# Patient Record
Sex: Male | Born: 1990 | Race: White | Hispanic: No | Marital: Single | Smoking: Former smoker
Health system: Southern US, Community
[De-identification: ages and names within clinical notes are randomized; demographics above are authoritative.]

## PROBLEM LIST (undated history)

## (undated) DIAGNOSIS — J45909 Unspecified asthma, uncomplicated: Secondary | ICD-10-CM

## (undated) DIAGNOSIS — F32A Depression, unspecified: Secondary | ICD-10-CM

## (undated) DIAGNOSIS — F329 Major depressive disorder, single episode, unspecified: Secondary | ICD-10-CM

## (undated) DIAGNOSIS — F419 Anxiety disorder, unspecified: Secondary | ICD-10-CM

## (undated) HISTORY — PX: WISDOM TOOTH EXTRACTION: SHX21

---

## 2014-05-13 ENCOUNTER — Emergency Department (HOSPITAL_COMMUNITY): Payer: Commercial Managed Care - PPO

## 2014-05-13 ENCOUNTER — Emergency Department (HOSPITAL_COMMUNITY)
Admission: EM | Admit: 2014-05-13 | Discharge: 2014-05-13 | Discharge status: Against Medical Advice | Payer: Commercial Managed Care - PPO | Attending: Emergency Medicine | Admitting: Emergency Medicine

## 2014-05-13 ENCOUNTER — Emergency Department (HOSPITAL_COMMUNITY)
Admission: EM | Admit: 2014-05-13 | Discharge: 2014-05-14 | Disposition: A | Discharge status: Home / Self Care | Payer: Commercial Managed Care - PPO | Attending: Emergency Medicine | Admitting: Emergency Medicine

## 2014-05-13 ENCOUNTER — Encounter (HOSPITAL_COMMUNITY): Payer: Self-pay | Admitting: *Deleted

## 2014-05-13 DIAGNOSIS — F419 Anxiety disorder, unspecified: Secondary | ICD-10-CM | POA: Insufficient documentation

## 2014-05-13 DIAGNOSIS — Z87891 Personal history of nicotine dependence: Secondary | ICD-10-CM | POA: Insufficient documentation

## 2014-05-13 DIAGNOSIS — F329 Major depressive disorder, single episode, unspecified: Secondary | ICD-10-CM | POA: Insufficient documentation

## 2014-05-13 DIAGNOSIS — J45901 Unspecified asthma with (acute) exacerbation: Secondary | ICD-10-CM | POA: Diagnosis not present

## 2014-05-13 DIAGNOSIS — M2550 Pain in unspecified joint: Secondary | ICD-10-CM | POA: Insufficient documentation

## 2014-05-13 DIAGNOSIS — M25561 Pain in right knee: Secondary | ICD-10-CM | POA: Diagnosis not present

## 2014-05-13 DIAGNOSIS — R0602 Shortness of breath: Secondary | ICD-10-CM | POA: Diagnosis present

## 2014-05-13 DIAGNOSIS — R52 Pain, unspecified: Secondary | ICD-10-CM

## 2014-05-13 DIAGNOSIS — J45909 Unspecified asthma, uncomplicated: Secondary | ICD-10-CM | POA: Insufficient documentation

## 2014-05-13 DIAGNOSIS — R531 Weakness: Secondary | ICD-10-CM | POA: Insufficient documentation

## 2014-05-13 HISTORY — DX: Depression, unspecified: F32.A

## 2014-05-13 HISTORY — DX: Unspecified asthma, uncomplicated: J45.909

## 2014-05-13 HISTORY — DX: Major depressive disorder, single episode, unspecified: F32.9

## 2014-05-13 HISTORY — DX: Anxiety disorder, unspecified: F41.9

## 2014-05-13 NOTE — ED Notes (Signed)
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

## 2014-05-13 NOTE — ED Notes (Signed)
Pt reports having pain that started to left knee yesterday, now is spreading to all his joints. Has redness and bruising around his knees. Reports weakness/numbness sensation to all extremities. No neuro deficits noted at triage. No acute distress noted at triage.

## 2014-05-14 LAB — CBC
HCT: 37.3 % — ABNORMAL LOW (ref 39.0–52.0)
HEMOGLOBIN: 12.8 g/dL — AB (ref 13.0–17.0)
MCH: 29.6 pg (ref 26.0–34.0)
MCHC: 34.3 g/dL (ref 30.0–36.0)
MCV: 86.1 fL (ref 78.0–100.0)
PLATELETS: 205 10*3/uL (ref 150–400)
RBC: 4.33 MIL/uL (ref 4.22–5.81)
RDW: 13.1 % (ref 11.5–15.5)
WBC: 7.9 10*3/uL (ref 4.0–10.5)

## 2014-05-14 LAB — BASIC METABOLIC PANEL
ANION GAP: 13 (ref 5–15)
BUN: 16 mg/dL (ref 6–23)
CO2: 24 mEq/L (ref 19–32)
CREATININE: 0.93 mg/dL (ref 0.50–1.35)
Calcium: 9.1 mg/dL (ref 8.4–10.5)
Chloride: 101 mEq/L (ref 96–112)
GFR calc non Af Amer: >90 mL/min (ref >90)
Glucose, Bld: 101 mg/dL — ABNORMAL HIGH (ref 70–99)
Potassium: 4.1 mEq/L (ref 3.7–5.3)
Sodium: 138 mEq/L (ref 137–147)

## 2014-05-14 LAB — CK: CK TOTAL: 464 U/L — AB (ref 7–232)

## 2014-05-14 MED ORDER — NAPROXEN 500 MG PO TABS: 500.0000 mg | ORAL_TABLET | Freq: Two times a day (BID) | ORAL | Status: AC

## 2014-05-14 MED ORDER — TRAMADOL HCL 50 MG PO TABS: 50.0000 mg | ORAL_TABLET | Freq: Four times a day (QID) | ORAL | Status: AC | PRN

## 2014-05-14 MED ORDER — KETOROLAC TROMETHAMINE 60 MG/2ML IM SOLN
60.0000 mg | Freq: Once | INTRAMUSCULAR | Status: AC
  Administered 2014-05-14: 60 mg via INTRAMUSCULAR
  Filled 2014-05-14: qty 2

## 2014-05-14 NOTE — ED Notes (Signed)
Patient resting quietly with eyes closed when entering the room. Appears in no respiratory distress.
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
## 2014-05-14 NOTE — Discharge Instructions (Signed)
Your xrays are normal - take the medicines as prescribed, return to the ER for   #1 worsening pain #2 fever #3 red or worsening swelling of the joints  Please call your doctor for a followup appointment within 24-48 hours. When you talk to your doctor please let them know that you were seen in the emergency department and have them acquire all of your records so that they can discuss the findings with you and formulate a treatment plan to fully care for your new and ongoing problems.
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
## 2016-01-29 ENCOUNTER — Emergency Department (HOSPITAL_COMMUNITY)
Admission: EM | Admit: 2016-01-29 | Discharge: 2016-01-29 | Disposition: A | Discharge status: Home / Self Care | Payer: Medicaid Other | Attending: Emergency Medicine | Admitting: Emergency Medicine

## 2016-01-29 ENCOUNTER — Encounter (HOSPITAL_COMMUNITY): Payer: Self-pay

## 2016-01-29 DIAGNOSIS — Z202 Contact with and (suspected) exposure to infections with a predominantly sexual mode of transmission: Secondary | ICD-10-CM | POA: Insufficient documentation

## 2016-01-29 DIAGNOSIS — J45909 Unspecified asthma, uncomplicated: Secondary | ICD-10-CM | POA: Diagnosis not present

## 2016-01-29 DIAGNOSIS — Z87891 Personal history of nicotine dependence: Secondary | ICD-10-CM | POA: Diagnosis not present

## 2016-01-29 MED ORDER — CEFTRIAXONE SODIUM 250 MG IJ SOLR
250.0000 mg | Freq: Once | INTRAMUSCULAR | Status: AC
  Administered 2016-01-29: 250 mg via INTRAMUSCULAR
  Filled 2016-01-29: qty 250

## 2016-01-29 MED ORDER — AZITHROMYCIN 250 MG PO TABS
1000.0000 mg | ORAL_TABLET | Freq: Once | ORAL | Status: AC
  Administered 2016-01-29: 1000 mg via ORAL
  Filled 2016-01-29: qty 4

## 2016-01-29 MED ORDER — LIDOCAINE HCL (PF) 1 % IJ SOLN
INTRAMUSCULAR | Status: AC
  Administered 2016-01-29: 1 mL
  Filled 2016-01-29: qty 5

## 2016-01-29 NOTE — ED Notes (Signed)
Patient Alert and oriented X4. Stable and ambulatory. Patient verbalized understanding of the discharge instructions.  Patient belongings were taken by the patient.  

## 2016-01-29 NOTE — ED Triage Notes (Signed)
Pt states he "was with another girl" and wants to be tested for sexually transmitted infections. He denies being symptomatic.

## 2016-01-29 NOTE — ED Provider Notes (Signed)
[facility]-EMERGENCY DEPT Provider Note   CSN: [id number] Arrival date & time: 01/29/16  2203     History   Chief Complaint Chief Complaint  Patient presents with  . Exposure to STD    HPI Gilbert Smith is a [age] y.o. male.  The history is provided by the patient and medical records.     25 year old male with history of anxiety, asthma, depression, presenting to the ED with likely exposure to STD. He states he was sexually active with a male a few days ago who reportedly told him that she was "positive" for some type of STD but he does not know what. States he does not have any symptoms currently but would like to be checked for STD.  He has no hx of same.  No urinary symptoms, abdominal pain, fever, chills, sweats, rash, or penile discharge.  Past Medical History:  Diagnosis Date  . Anxiety   . Asthma   . Depression     There are no active problems to display for this patient.   Past Surgical History:  Procedure Laterality Date  . WISDOM TOOTH EXTRACTION         Home Medications    Prior to Admission medications   Medication Sig Start Date End Date Taking? Authorizing Provider  acetaminophen (TYLENOL) 500 MG tablet Take 500-1,000 mg by mouth every 6 (six) hours as needed (for pain.).    Historical Provider, MD  naproxen (NAPROSYN) 500 MG tablet Take 1 tablet (500 mg total) by mouth 2 (two) times daily with a meal. 05/14/14   Eber HongBrian Miller, MD  traMADol (ULTRAM) 50 MG tablet Take 1 tablet (50 mg total) by mouth every 6 (six) hours as needed. 05/14/14   Eber HongBrian Miller, MD    Family History No family history on file.  Social History Social History  Substance Use Topics  . Smoking status: Former Smoker    Quit date: 11/13/2013  . Smokeless tobacco: Never Used  . Alcohol use No     Allergies   Review of patient's allergies indicates no known allergies.   Review of Systems Review of Systems  Genitourinary:       STD check  All other systems reviewed and are  negative.    Physical Exam Updated Vital Signs BP 134/89 (BP Location: Right Arm)   Pulse 76   Temp 98.4 F (36.9 C) (Oral)   Resp 20   Wt 80.9 kg   SpO2 100%   BMI 22.91 kg/m   Physical Exam  Constitutional: He is oriented to person, place, and time. He appears well-developed and well-nourished.  HENT:  Head: Normocephalic and atraumatic.  Mouth/Throat: Oropharynx is clear and moist.  Eyes: Conjunctivae and EOM are normal. Pupils are equal, round, and reactive to light.  Neck: Normal range of motion.  Cardiovascular: Normal rate, regular rhythm and normal heart sounds.   Pulmonary/Chest: Effort normal and breath sounds normal.  Abdominal: Soft. Bowel sounds are normal.  Musculoskeletal: Normal range of motion.  Neurological: He is alert and oriented to person, place, and time.  Skin: Skin is warm and dry.  Psychiatric: He has a normal mood and affect.  Nursing note and vitals reviewed.    ED Treatments / Results  Labs (all labs ordered are listed, but only abnormal results are displayed) Labs Reviewed  HIV ANTIBODY (ROUTINE TESTING)  RPR  [facility]    EKG  EKG Interpretation None       Radiology No  results found.  Procedures Procedures (including critical care time)  Medications Ordered in ED Medications  azithromycin (ZITHROMAX) tablet 1,000 mg (1,000 mg Oral Given 01/29/16 2315)  cefTRIAXone (ROCEPHIN) injection 250 mg (250 mg Intramuscular Given 01/29/16 2315)  lidocaine (PF) (XYLOCAINE) 1 % injection (1 mL  Given 01/29/16 2315)     Initial Impression / Assessment and Plan / ED Course  I have reviewed the triage vital signs and the nursing notes.  Pertinent labs & imaging results that were available during my care of the patient were reviewed by me and considered in my medical decision making (see chart for details).  Clinical Course   25 y.o. M here for STD check.  Reports recent exposure with someone who  was "positive" for something but not sure what.  He is asymptomatic.  Gc/Chl, HIV, RPR pending.  Treated empirically with rocephin and azithromycin here.  Advised he will be contacted if his test results are positive. He may follow-up with the health department for future STD screenings.  Discussed plan with patient, he acknowledged understanding and agreed with plan of care.  Return precautions given for new or worsening symptoms.  Final Clinical Impressions(s) / ED Diagnoses   Final diagnoses:  Possible exposure to STD    New Prescriptions Discharge Medication List as of 01/29/2016 11:29 PM       Garlon HatchetLisa M Tamari Busic, PA-C 01/29/16 [phone]    Lavera Guiseana Duo Liu, MD 01/30/16 [phone]

## 2016-01-29 NOTE — ED Notes (Signed)
Patient is A&Ox4 at this time.  Patient in no signs of distress.  Please see providers note for complete history and physical exam.  

## 2016-01-29 NOTE — Discharge Instructions (Signed)
You have been treated for gonorrhea or chlamydia. Your test results should come back over the next 24-48 hours.  You will be contacted if they are abnormal. You may follow-up with the health dept for STD screenings in the future. Return here for new concerns.

## 2016-01-30 LAB — GC/CHLAMYDIA PROBE AMP (~~LOC~~) NOT AT ARMC
Chlamydia: NEGATIVE
Neisseria Gonorrhea: NEGATIVE

## 2016-01-30 LAB — RPR: RPR: NONREACTIVE

## 2016-01-30 LAB — HIV ANTIBODY (ROUTINE TESTING W REFLEX): HIV Screen 4th Generation wRfx: NONREACTIVE

## 2018-11-29 DIAGNOSIS — Z20828 Contact with and (suspected) exposure to other viral communicable diseases: Secondary | ICD-10-CM | POA: Diagnosis not present

## 2019-01-17 ENCOUNTER — Encounter (HOSPITAL_COMMUNITY): Payer: Self-pay | Admitting: Emergency Medicine

## 2019-01-17 ENCOUNTER — Emergency Department (HOSPITAL_COMMUNITY)
Admission: EM | Admit: 2019-01-17 | Discharge: 2019-01-18 | Discharge status: Against Medical Advice | Payer: Medicaid Other | Attending: Emergency Medicine | Admitting: Emergency Medicine

## 2019-01-17 DIAGNOSIS — Z5321 Procedure and treatment not carried out due to patient leaving prior to being seen by health care provider: Secondary | ICD-10-CM | POA: Diagnosis not present

## 2019-01-17 DIAGNOSIS — Z202 Contact with and (suspected) exposure to infections with a predominantly sexual mode of transmission: Secondary | ICD-10-CM | POA: Diagnosis present

## 2019-01-17 NOTE — ED Notes (Signed)
Pt called for vitals x2 with no response 

## 2019-01-17 NOTE — ED Notes (Signed)
Pt called x3 no reponse. 

## 2019-01-17 NOTE — ED Notes (Signed)
Pt did not answer for vitals x3 

## 2019-01-17 NOTE — ED Notes (Signed)
Called for vitals x1 with no response 

## 2019-01-17 NOTE — ED Triage Notes (Signed)
Pt reports girl friend was diagnosed with an std today and would also like to be tested. Pt has no sxs.

## 2019-01-24 DIAGNOSIS — Z1388 Encounter for screening for disorder due to exposure to contaminants: Secondary | ICD-10-CM | POA: Diagnosis not present

## 2019-01-24 DIAGNOSIS — Z0389 Encounter for observation for other suspected diseases and conditions ruled out: Secondary | ICD-10-CM | POA: Diagnosis not present

## 2019-01-24 DIAGNOSIS — Z3009 Encounter for other general counseling and advice on contraception: Secondary | ICD-10-CM | POA: Diagnosis not present

## 2019-07-12 DIAGNOSIS — Z20828 Contact with and (suspected) exposure to other viral communicable diseases: Secondary | ICD-10-CM | POA: Diagnosis not present
